# Patient Record
Sex: Male | Born: 1995 | Race: Black or African American | Hispanic: No | Marital: Single | Smoking: Never smoker
Health system: Southern US, Community
[De-identification: ages and names within clinical notes are randomized; demographics above are authoritative.]

---

## 2016-09-23 ENCOUNTER — Encounter (HOSPITAL_COMMUNITY): Payer: Self-pay | Admitting: Emergency Medicine

## 2016-09-23 ENCOUNTER — Emergency Department (HOSPITAL_COMMUNITY)
Admission: EM | Admit: 2016-09-23 | Discharge: 2016-09-23 | Disposition: A | Discharge status: Home / Self Care | Payer: Self-pay | Attending: Emergency Medicine | Admitting: Emergency Medicine

## 2016-09-23 DIAGNOSIS — Y929 Unspecified place or not applicable: Secondary | ICD-10-CM | POA: Insufficient documentation

## 2016-09-23 DIAGNOSIS — Y999 Unspecified external cause status: Secondary | ICD-10-CM | POA: Insufficient documentation

## 2016-09-23 DIAGNOSIS — T148XXA Other injury of unspecified body region, initial encounter: Secondary | ICD-10-CM

## 2016-09-23 DIAGNOSIS — M545 Low back pain: Secondary | ICD-10-CM | POA: Insufficient documentation

## 2016-09-23 DIAGNOSIS — X58XXXA Exposure to other specified factors, initial encounter: Secondary | ICD-10-CM | POA: Insufficient documentation

## 2016-09-23 DIAGNOSIS — Y9364 Activity, baseball: Secondary | ICD-10-CM | POA: Insufficient documentation

## 2016-09-23 DIAGNOSIS — S39011A Strain of muscle, fascia and tendon of abdomen, initial encounter: Secondary | ICD-10-CM | POA: Insufficient documentation

## 2016-09-23 LAB — URINALYSIS, ROUTINE W REFLEX MICROSCOPIC
Bilirubin Urine: NEGATIVE
Glucose, UA: NEGATIVE mg/dL
Hgb urine dipstick: NEGATIVE
KETONES UR: NEGATIVE mg/dL
LEUKOCYTES UA: NEGATIVE
NITRITE: NEGATIVE
Protein, ur: NEGATIVE mg/dL
SPECIFIC GRAVITY, URINE: 1.03 (ref 1.005–1.030)
pH: 7 (ref 5.0–8.0)

## 2016-09-23 MED ORDER — IBUPROFEN 600 MG PO TABS: 600.0000 mg | ORAL_TABLET | Freq: Four times a day (QID) | ORAL | 0 refills | Status: AC | PRN

## 2016-09-23 MED ORDER — IBUPROFEN 400 MG PO TABS
600.0000 mg | ORAL_TABLET | Freq: Once | ORAL | Status: AC
  Administered 2016-09-23: 20:00:00 600 mg via ORAL
  Filled 2016-09-23: qty 1

## 2016-09-23 NOTE — ED Provider Notes (Signed)
[facility] Provider Note    By signing my name below, I, Ronald Richards, attest that this documentation has been prepared under the direction and in the presence of [facility], [place]. Electronically Signed: Earmon Richards, ED Scribe. 09/23/16. 8:24 PM.    History   Chief Complaint Chief Complaint  Patient presents with  . Flank Pain   The history is provided by the patient and medical records. No language interpreter was used.    Ronald Richards is a 21 y.o. male who presents to the Emergency Department complaining of cramping left side flank pain that began earlier today while playing baseball. He states he did sit ups and jumping jacks prior to playing the game. He states the pain began while running to a base. He has not taken anything for pain relief. Laughing and movements increase the pain. Being still helps alleviate the pain. He denies fever, chills, nausea, vomiting or abdominal pain.   History reviewed. No pertinent past medical history.  There are no active problems to display for this patient.   History reviewed. No pertinent surgical history.     Home Medications    Prior to Admission medications   Medication Sig Start Date End Date Taking? Authorizing Provider  ibuprofen (ADVIL,MOTRIN) 600 MG tablet Take 1 tablet (600 mg total) by mouth every 6 (six) hours as needed. 09/23/16   Moria Brophy Orlene Och, NP    Family History History reviewed. No pertinent family history.  Social History Social History  Substance Use Topics  . Smoking status: Never Smoker  . Smokeless tobacco: Never Used  . Alcohol use No     Allergies   Patient has no known allergies.   Review of Systems Review of Systems  Constitutional: Negative for chills and fever.  Respiratory: Negative for cough, chest tightness and shortness of breath.   Cardiovascular: Negative for chest pain.  Gastrointestinal: Negative for abdominal pain, nausea and vomiting.  Genitourinary:  Positive for flank pain. Negative for dysuria and frequency.  Musculoskeletal: Positive for arthralgias. Negative for back pain (left flank pain).  Skin: Negative for color change and wound.  Neurological: Negative for weakness, light-headedness and headaches.  Psychiatric/Behavioral: Negative for confusion.     Physical Exam Updated Vital Signs BP 140/84 (BP Location: Right Arm)   Pulse 65   Temp 97.9 F (36.6 C) (Oral)   Resp 18   SpO2 100%   Physical Exam  Constitutional: He is oriented to person, place, and time. He appears well-developed and well-nourished. No distress.  HENT:  Head: Normocephalic and atraumatic.  Mouth/Throat: Mucous membranes are normal.  Eyes: EOM are normal. Pupils are equal, round, and reactive to light.  Neck: Normal range of motion. Neck supple.  Cardiovascular: Normal rate and regular rhythm.   Adequate circulation. Radial pulses 2+ bilaterally.  Pulmonary/Chest: Effort normal and breath sounds normal.  Abdominal: Soft. Bowel sounds are normal. There is no tenderness. There is no CVA tenderness.  Musculoskeletal: Normal range of motion. He exhibits no edema.       Lumbar back: He exhibits tenderness. He exhibits normal range of motion, no deformity, no spasm and normal pulse.  Pain to the left side with SLR of LLE. Tenderness to palpation of musculature of left sided anterior rib area. Pain with ROM.  Neurological: He is alert and oriented to person, place, and time. He has normal strength. Gait normal.  Reflex Scores:      Bicep reflexes are 2+ on the right side and 2+ on  the left side.      Brachioradialis reflexes are 2+ on the right side and 2+ on the left side.      Patellar reflexes are 2+ on the right side and 2+ on the left side.      Achilles reflexes are 2+ on the right side and 2+ on the left side. DTRs equal and symmetric bilaterally. Grip strength normal.  Skin: Skin is warm and dry.  Psychiatric: He has a normal mood and affect. His  behavior is normal.  Nursing note and vitals reviewed.    ED Treatments / Results  DIAGNOSTIC STUDIES: Oxygen Saturation is 100% on RA, normal by my interpretation.   COORDINATION OF CARE: 7:35 PM- Will check urinalysis. Pt verbalizes understanding and agrees to plan.  Medications  ibuprofen (ADVIL,MOTRIN) tablet 600 mg (600 mg Oral Given 09/23/16 2004)    Labs (all labs ordered are listed, but only abnormal results are displayed) Labs Reviewed  URINALYSIS, ROUTINE W REFLEX MICROSCOPIC - Abnormal; Notable for the following:       Result Value   APPearance CLOUDY (*)    All other components within normal limits    Radiology No results found.  Procedures Procedures (including critical care time)  Medications Ordered in ED Medications  ibuprofen (ADVIL,MOTRIN) tablet 600 mg (600 mg Oral Given 09/23/16 2004)     Initial Impression / Assessment and Plan / ED Course  I have reviewed the triage vital signs and the nursing notes.  Pertinent lab results that were available during my care of the patient were reviewed by me and considered in my medical decision making (see chart for details).    Patient here for muscle strain that began earlier today while playing baseball. He reports doing other exercises prior to onset of pain. Pt advised to follow up with PCP for continued symptoms. Conservative therapy recommended and discussed. Patient will be discharged home & is agreeable with above plan. Returns precautions discussed. Pt appears safe for discharge.  I personally performed the services described in this documentation, which was scribed in my presence. The recorded information has been reviewed and is accurate.   Final Clinical Impressions(s) / ED Diagnoses   Final diagnoses:  Muscle strain    New Prescriptions New Prescriptions   IBUPROFEN (ADVIL,MOTRIN) 600 MG TABLET    Take 1 tablet (600 mg total) by mouth every 6 (six) hours as needed.     [address]Hope FrederickM Dyrell Tuccillo,  [place]NP 09/23/16 2059    Ronald MondayErin Schlossman, MD 09/24/16 1051

## 2016-09-23 NOTE — ED Triage Notes (Signed)
Pt sts left sided flank pain after being in PE today

## 2017-10-12 ENCOUNTER — Other Ambulatory Visit: Payer: Self-pay | Admitting: Internal Medicine

## 2017-10-12 ENCOUNTER — Ambulatory Visit
Admission: RE | Admit: 2017-10-12 | Discharge: 2017-10-12 | Disposition: A | Discharge status: Home / Self Care | Payer: Self-pay | Source: Ambulatory Visit | Attending: Internal Medicine | Admitting: Internal Medicine

## 2017-10-12 DIAGNOSIS — A150 Tuberculosis of lung: Secondary | ICD-10-CM

## 2018-09-03 IMAGING — CR DG CHEST 1V
1 series · 1 of 1 positions shown · non-contrast
Comparison: None.

CLINICAL DATA: Positive PPD.

EXAM:
CHEST 1 VIEW

[w chest pa]
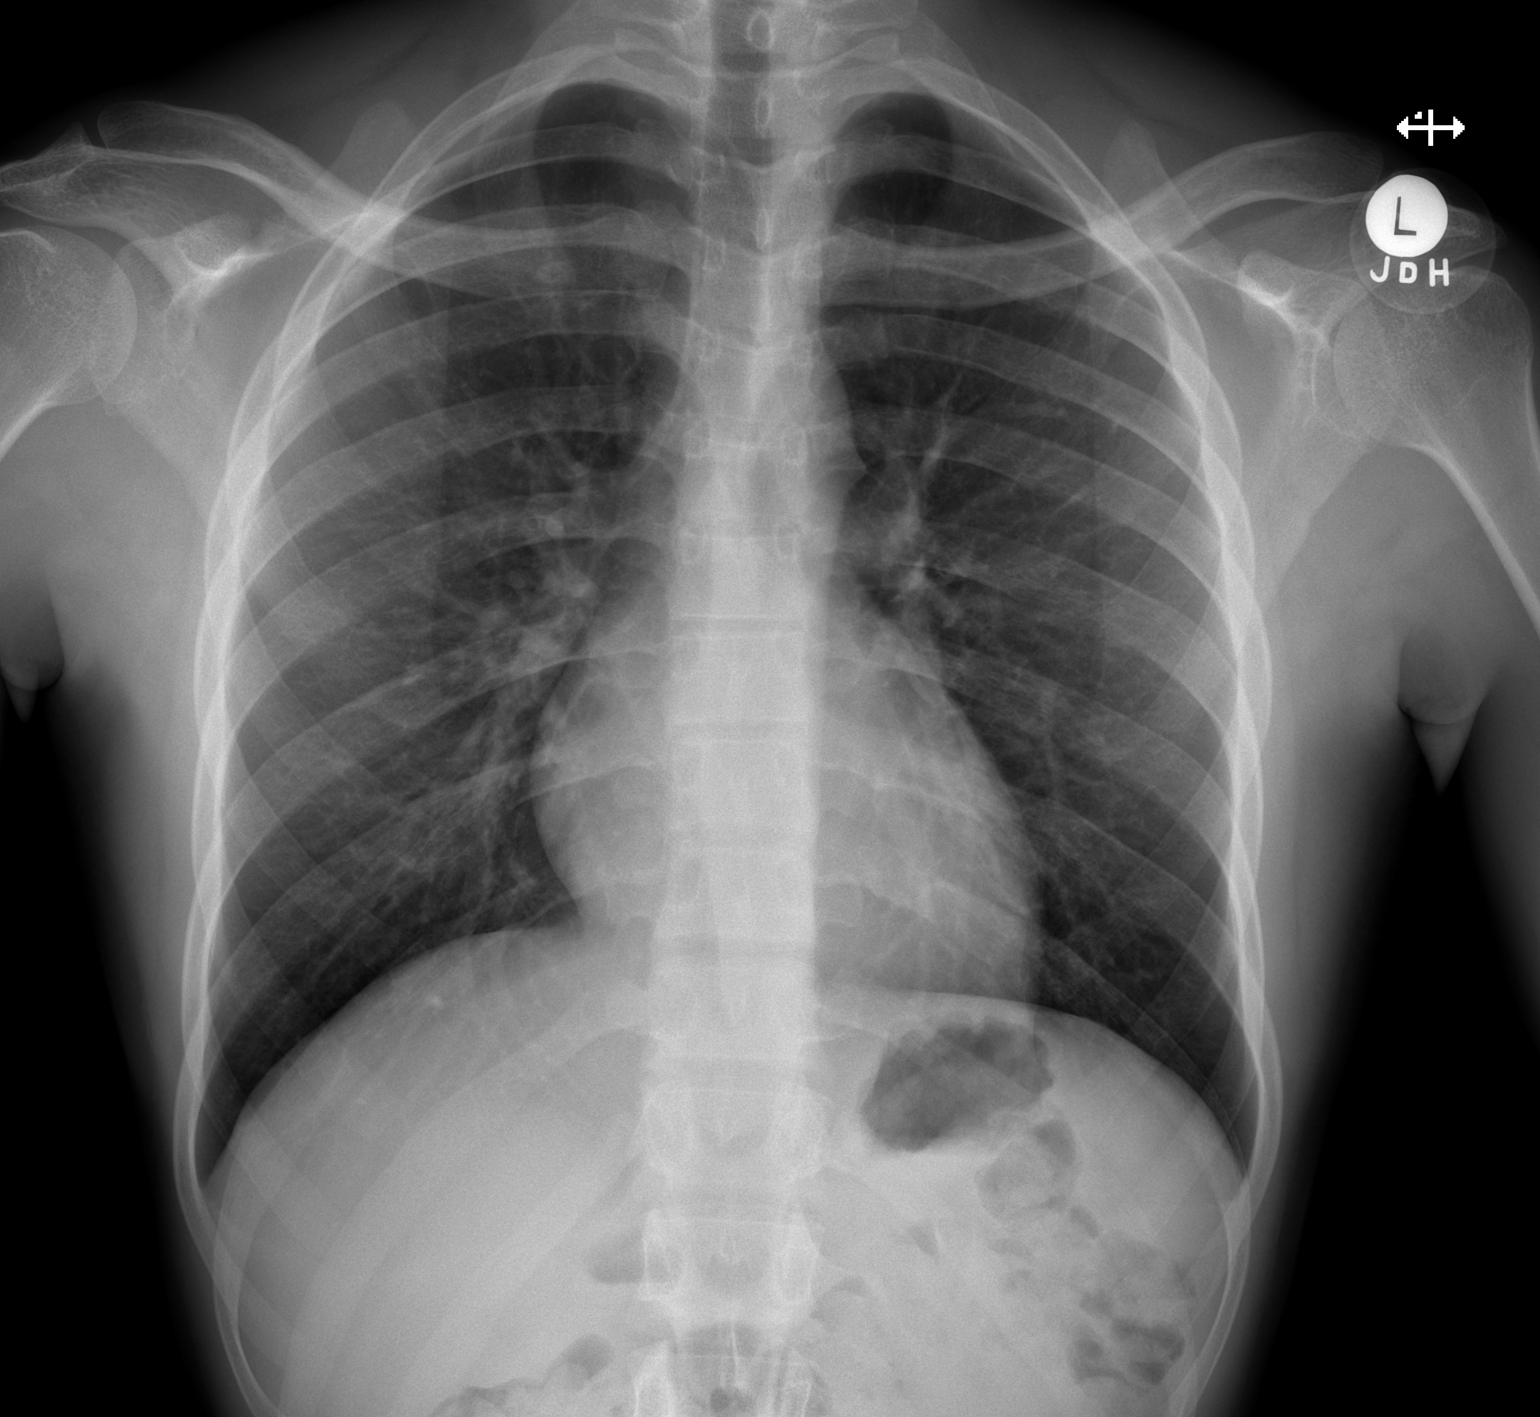

[1 of 1 positions shown; findings below may reference images not displayed]

FINDINGS: The heart size and mediastinal contours are within normal limits.
Both lungs are clear. The visualized skeletal structures are
unremarkable.
IMPRESSION: No active disease. Lungs are clear. No radiographic evidence of TB.

## 2022-11-19 ENCOUNTER — Emergency Department (HOSPITAL_COMMUNITY): Payer: Self-pay

## 2022-11-19 ENCOUNTER — Encounter (HOSPITAL_COMMUNITY): Payer: Self-pay

## 2022-11-19 ENCOUNTER — Emergency Department (HOSPITAL_COMMUNITY)
Admission: EM | Admit: 2022-11-19 | Discharge: 2022-11-19 | Disposition: A | Discharge status: Home / Self Care | Payer: Self-pay | Attending: Emergency Medicine | Admitting: Emergency Medicine

## 2022-11-19 ENCOUNTER — Other Ambulatory Visit: Payer: Self-pay

## 2022-11-19 DIAGNOSIS — R9089 Other abnormal findings on diagnostic imaging of central nervous system: Secondary | ICD-10-CM

## 2022-11-19 DIAGNOSIS — R519 Headache, unspecified: Secondary | ICD-10-CM | POA: Insufficient documentation

## 2022-11-19 DIAGNOSIS — Z23 Encounter for immunization: Secondary | ICD-10-CM | POA: Insufficient documentation

## 2022-11-19 DIAGNOSIS — R9402 Abnormal brain scan: Secondary | ICD-10-CM | POA: Insufficient documentation

## 2022-11-19 DIAGNOSIS — R55 Syncope and collapse: Secondary | ICD-10-CM

## 2022-11-19 LAB — RAPID URINE DRUG SCREEN, HOSP PERFORMED
Amphetamines: NOT DETECTED
Barbiturates: NOT DETECTED
Benzodiazepines: NOT DETECTED
Cocaine: NOT DETECTED
Opiates: NOT DETECTED
Tetrahydrocannabinol: POSITIVE — AB

## 2022-11-19 LAB — URINALYSIS, ROUTINE W REFLEX MICROSCOPIC
Bilirubin Urine: NEGATIVE
Glucose, UA: NEGATIVE mg/dL
Hgb urine dipstick: NEGATIVE
Ketones, ur: NEGATIVE mg/dL
Leukocytes,Ua: NEGATIVE
Nitrite: NEGATIVE
Protein, ur: NEGATIVE mg/dL
Specific Gravity, Urine: 1.012 (ref 1.005–1.030)
pH: 8 (ref 5.0–8.0)

## 2022-11-19 LAB — BASIC METABOLIC PANEL
Anion gap: 10 (ref 5–15)
BUN: 13 mg/dL (ref 6–20)
CO2: 24 mmol/L (ref 22–32)
Calcium: 9.5 mg/dL (ref 8.9–10.3)
Chloride: 102 mmol/L (ref 98–111)
Creatinine, Ser: 0.85 mg/dL (ref 0.61–1.24)
GFR, Estimated: >60 mL/min (ref >60)
Glucose, Bld: 131 mg/dL — ABNORMAL HIGH (ref 70–99)
Potassium: 4.1 mmol/L (ref 3.5–5.1)
Sodium: 136 mmol/L (ref 135–145)

## 2022-11-19 LAB — CBC
HCT: 44.8 % (ref 39.0–52.0)
Hemoglobin: 14.5 g/dL (ref 13.0–17.0)
MCH: 28.4 pg (ref 26.0–34.0)
MCHC: 32.4 g/dL (ref 30.0–36.0)
MCV: 87.7 fL (ref 80.0–100.0)
Platelets: 221 10*3/uL (ref 150–400)
RBC: 5.11 MIL/uL (ref 4.22–5.81)
RDW: 12.8 % (ref 11.5–15.5)
WBC: 5 10*3/uL (ref 4.0–10.5)
nRBC: 0 % (ref 0.0–0.2)

## 2022-11-19 LAB — CBG MONITORING, ED
Glucose-Capillary: 69 mg/dL — ABNORMAL LOW (ref 70–99)
Glucose-Capillary: 96 mg/dL (ref 70–99)

## 2022-11-19 MED ORDER — GADOBUTROL 1 MMOL/ML IV SOLN
6.5000 mL | Freq: Once | INTRAVENOUS | Status: AC | PRN
  Administered 2022-11-19: 6.5 mL via INTRAVENOUS

## 2022-11-19 MED ORDER — TETANUS-DIPHTH-ACELL PERTUSSIS 5-2.5-18.5 LF-MCG/0.5 IM SUSY
0.5000 mL | PREFILLED_SYRINGE | Freq: Once | INTRAMUSCULAR | Status: AC
  Administered 2022-11-19: 0.5 mL via INTRAMUSCULAR
  Filled 2022-11-19: qty 0.5

## 2022-11-19 MED ORDER — LORAZEPAM 2 MG/ML IJ SOLN
1.0000 mg | Freq: Once | INTRAMUSCULAR | Status: AC
Start: 2022-11-19 — End: 2022-11-19
  Administered 2022-11-19: 1 mg via INTRAVENOUS
  Filled 2022-11-19: qty 1

## 2022-11-19 MED ORDER — ACETAMINOPHEN 325 MG PO TABS
650.0000 mg | ORAL_TABLET | Freq: Once | ORAL | Status: AC
  Administered 2022-11-19: 650 mg via ORAL
  Filled 2022-11-19: qty 2

## 2022-11-19 NOTE — ED Provider Notes (Signed)
[place] EMERGENCY DEPARTMENT AT [facility] Provider Note   CSN: [id number] Arrival date & time: 11/19/22  1209     History {Add pertinent medical, surgical, social history, OB history to HPI:1} Chief Complaint  Patient presents with   Loss of Consciousness    Ronald Richards is a 27 y.o. male.  Patient was brought in by ambulance from work after possible syncopal event.  He said he had not eaten or drank anything today and was sitting in a meeting.  He said he was angry about something was going on and then he felt very weak and then he woke up on the ground with everyone standing around him.  He feels back to baseline now.  He did say he hit his head when he fell down and has a small laceration at his right eyebrow.  No chest pain shortness of breath numbness weakness headache shortness of breath.  He said he has had syncopal events in the past.  He denies any medical problems and does not take any routine medications.  No bowel or bladder incontinence.  The history is provided by the patient.  Loss of Consciousness Episode history:  Single Most recent episode:  Today Progression:  Resolved Chronicity:  New Witnessed: yes   Relieved by:  None tried Worsened by:  Nothing Ineffective treatments:  None tried Associated symptoms: no chest pain, no difficulty breathing, no fever, no headaches and no shortness of breath   Risk factors: no congenital heart disease, no coronary artery disease and no seizures        Home Medications Prior to Admission medications   Medication Sig Start Date End Date Taking? Authorizing Provider  ibuprofen (ADVIL,MOTRIN) 600 MG tablet Take 1 tablet (600 mg total) by mouth every 6 (six) hours as needed. 09/23/16   Janne Napoleon, NP      Allergies    Patient has no known allergies.    Review of Systems   Review of Systems  Constitutional:  Negative for fever.  Eyes:  Negative for visual disturbance.  Respiratory:  Negative  for shortness of breath.   Cardiovascular:  Positive for syncope. Negative for chest pain.  Gastrointestinal:  Negative for abdominal pain.  Musculoskeletal:  Negative for neck pain.  Neurological:  Negative for headaches.    Physical Exam Updated Vital Signs BP 130/75 (BP Location: Right Arm)   Pulse 60   Temp 98.1 F (36.7 C) (Oral)   Resp 17   Ht 5\' 9"  (1.753 m)   Wt 63 kg   SpO2 100%   BMI 20.53 kg/m  Physical Exam Vitals and nursing note reviewed.  Constitutional:      General: He is not in acute distress.    Appearance: Normal appearance. He is well-developed.  HENT:     Head: Normocephalic.     Comments: He has a small contusion in his right eyebrow with a tiny laceration that is not open and does not need any closure Eyes:     Conjunctiva/sclera: Conjunctivae normal.  Cardiovascular:     Rate and Rhythm: Normal rate and regular rhythm.     Heart sounds: No murmur heard. Pulmonary:     Effort: Pulmonary effort is normal. No respiratory distress.     Breath sounds: Normal breath sounds.  Abdominal:     Palpations: Abdomen is soft.     Tenderness: There is no abdominal tenderness. There is no guarding or rebound.  Musculoskeletal:  General: Normal range of motion.     Cervical back: Neck supple.     Right lower leg: No edema.     Left lower leg: No edema.  Skin:    General: Skin is warm and dry.     Capillary Refill: Capillary refill takes less than 2 seconds.  Neurological:     General: No focal deficit present.     Mental Status: He is alert and oriented to person, place, and time.     Cranial Nerves: No cranial nerve deficit.     Sensory: No sensory deficit.     Motor: No weakness.     Gait: Gait normal.     ED Results / Procedures / Treatments   Labs (all labs ordered are listed, but only abnormal results are displayed) Labs Reviewed  BASIC METABOLIC PANEL - Abnormal; Notable for the following components:      Result Value   Glucose, Bld  131 (*)    All other components within normal limits  CBC  URINALYSIS, ROUTINE W REFLEX MICROSCOPIC  CBG MONITORING, ED    EKG EKG Interpretation  Date/Time:  Wednesday November 19 2022 12:21:02 EDT Ventricular Rate:  55 PR Interval:  156 QRS Duration: 92 QT Interval:  382 QTC Calculation: 365 R Axis:   79 Text Interpretation: Sinus bradycardia Early repolarization Otherwise normal ECG No previous ECGs available Confirmed by Meridee Score [phone]) on 11/19/2022 4:40:48 PM  Radiology CT Head Wo Contrast  Result Date: 11/19/2022 CLINICAL DATA:  Trauma EXAM: CT HEAD WITHOUT CONTRAST TECHNIQUE: Contiguous axial images were obtained from the base of the skull through the vertex without intravenous contrast. RADIATION DOSE REDUCTION: This exam was performed according to the departmental dose-optimization program which includes automated exposure control, adjustment of the mA and/or kV according to patient size and/or use of iterative reconstruction technique. COMPARISON:  None Available. FINDINGS: Brain: There is no evidence of acute intracranial hemorrhage, extra-axial fluid collection, or acute infarct. Parenchymal volume is normal. The ventricles are normal in size. There is patchy hypodensity in the supratentorial white matter which is abnormal for age. The pituitary and suprasellar region are normal. There is no mass lesion. There is no mass effect or midline shift. Vascular: No hyperdense vessel or unexpected calcification. Skull: Normal. Negative for fracture or focal lesion. Sinuses/Orbits: Normal. Negative for fracture or focal lesion. There is mild mucosal thickening in the paranasal sinuses. The globes and orbits are unremarkable. Other: None. IMPRESSION: Patchy hypodensity in the supratentorial white matter is abnormal for age. Recommend brain MRI with and without contrast for further evaluation. Electronically Signed   By: Lesia Hausen M.D.   On: 11/19/2022 13:16    Procedures Procedures   {Document cardiac monitor, telemetry assessment procedure when appropriate:1}  Medications Ordered in ED Medications  Tdap (BOOSTRIX) injection 0.5 mL (has no administration in time range)    ED Course/ Medical Decision Making/ A&P   {   Click here for ABCD2, HEART and other calculatorsREFRESH Note before signing :1}                          Medical Decision Making Amount and/or Complexity of Data Reviewed Labs: ordered.   This patient complains of ***; this involves an extensive number of treatment Options and is a complaint that carries with it a high risk of complications and morbidity. The differential includes ***  I ordered, reviewed and interpreted labs, which included *** I ordered medication ***  and reviewed PMP when indicated. I ordered imaging studies which included *** and I independently    visualized and interpreted imaging which showed *** Additional history obtained from *** Previous records obtained and reviewed *** I consulted *** and discussed lab and imaging findings and discussed disposition.  Cardiac monitoring reviewed, *** Social determinants considered, *** Critical Interventions: ***  After the interventions stated above, I reevaluated the patient and found *** Admission and further testing considered, ***   {Document critical care time when appropriate:1} {Document review of labs and clinical decision tools ie heart score, Chads2Vasc2 etc:1}  {Document your independent review of radiology images, and any outside records:1} {Document your discussion with family members, caretakers, and with consultants:1} {Document social determinants of health affecting pt's care:1} {Document your decision making why or why not admission, treatments were needed:1} Final Clinical Impression(s) / ED Diagnoses Final diagnoses:  None    Rx / DC Orders ED Discharge Orders     None

## 2022-11-19 NOTE — ED Notes (Signed)
Patient provided graham crackers and drink.

## 2022-11-19 NOTE — ED Triage Notes (Signed)
Pt BIB [facility] from work c/o LOC. Pt states he has not had anything to eat or drink today. Pt was sitting in a work meeting and was talking finished what he said and all of a sudden got weak and was unable to hear anything and then next thing he knows he woke up  on the ground and they said he passed out.

## 2022-11-19 NOTE — ED Notes (Signed)
Patient transported to MRI 

## 2022-11-19 NOTE — ED Provider Triage Note (Signed)
Emergency Medicine Provider Triage Evaluation Note  Ronald Richards , a 27 y.o. male  was evaluated in triage.  Pt complains of syncope. Was at a work meeting this AM. While standing he felt lightheadedness then syncopized.  Struck head against floor.  Mild headache, no neck pain, no cp or palpitation.  Noever had this before.  Didn't eat breakfast this AM.  Was standing when this happened  Review of Systems  Positive: As above Negative: As above  Physical Exam  BP 127/73 (BP Location: Right Arm)   Pulse (!) 53   Temp 98.2 F (36.8 C)   Resp 17   Ht 5\' 9"  (1.753 m)   Wt 63 kg   SpO2 100%   BMI 20.53 kg/m  Gen:   Awake, no distress   Resp:  Normal effort  MSK:   Moves extremities without difficulty  Other:    Medical Decision Making  Medically screening exam initiated at 12:26 PM.  Appropriate orders placed.  Ronald Richards was informed that the remainder of the evaluation will be completed by another provider, this initial triage assessment does not replace that evaluation, and the importance of remaining in the ED until their evaluation is complete.     Fayrene Helper, PA-C 11/19/22 1228

## 2022-11-19 NOTE — Discharge Instructions (Signed)
You were seen in the emergency department for a syncopal/fainting episode at work.  You had blood work and an EKG that was unremarkable.  You had a CAT scan and MRI of your brain that did show some findings that we will need follow-up with neurology.  This may have something to do with your fainting spell.  Will be important for you to follow-up with them.  Please return to the emergency department for any worsening or concerning symptoms.

## 2023-08-06 ENCOUNTER — Ambulatory Visit (HOSPITAL_COMMUNITY)
Admission: EM | Admit: 2023-08-06 | Discharge: 2023-08-06 | Disposition: A | Discharge status: Home / Self Care | Payer: Commercial Managed Care - HMO | Attending: Emergency Medicine | Admitting: Emergency Medicine

## 2023-08-06 ENCOUNTER — Encounter (HOSPITAL_COMMUNITY): Payer: Self-pay

## 2023-08-06 DIAGNOSIS — A084 Viral intestinal infection, unspecified: Secondary | ICD-10-CM

## 2023-08-06 DIAGNOSIS — R112 Nausea with vomiting, unspecified: Secondary | ICD-10-CM

## 2023-08-06 MED ORDER — ONDANSETRON 4 MG PO TBDP
4.0000 mg | ORAL_TABLET | Freq: Once | ORAL | Status: AC
  Administered 2023-08-06: 4 mg via ORAL

## 2023-08-06 MED ORDER — ONDANSETRON 4 MG PO TBDP
ORAL_TABLET | ORAL | Status: AC
  Filled 2023-08-06: qty 1

## 2023-08-06 MED ORDER — ONDANSETRON HCL 4 MG PO TABS: 4.0000 mg | ORAL_TABLET | Freq: Four times a day (QID) | ORAL | 0 refills | Status: AC

## 2023-08-06 NOTE — ED Provider Notes (Signed)
[facility]    CSN: [id number] Arrival date & time: 08/06/23  1810      History   Chief Complaint Chief Complaint  Patient presents with   Emesis   Fever    HPI Ronald Richards is a 27 y.o. male.   Patient reporting fever this morning with emesis x 3.  Emesis content includes mostly undigested food from last night.  No one else is reporting being sick.  He has recently been able to hold down some water but no food at this time.  The history is provided by the patient.  Emesis Associated symptoms: fever   Fever Associated symptoms: nausea and vomiting     History reviewed. No pertinent past medical history.  There are no active problems to display for this patient.   History reviewed. No pertinent surgical history.     Home Medications    Prior to Admission medications   Medication Sig Start Date End Date Taking? Authorizing Provider  ondansetron (ZOFRAN) 4 MG tablet Take 1 tablet (4 mg total) by mouth every 6 (six) hours. 08/06/23  Yes Marque Bango, Linde Gillis, NP  ibuprofen (ADVIL,MOTRIN) 600 MG tablet Take 1 tablet (600 mg total) by mouth every 6 (six) hours as needed. 09/23/16   Janne Napoleon, NP    Family History History reviewed. No pertinent family history.  Social History Social History   Tobacco Use   Smoking status: Some Days    Types: Cigarettes   Smokeless tobacco: Never  Vaping Use   Vaping status: Every Day   Substances: Nicotine, Flavoring  Substance Use Topics   Alcohol use: No   Drug use: No     Allergies   Patient has no known allergies.   Review of Systems Review of Systems  Constitutional:  Positive for fever.  Gastrointestinal:  Positive for nausea and vomiting.     Physical Exam Triage Vital Signs ED Triage Vitals  Encounter Vitals Group     BP 08/06/23 1849 120/72     Systolic BP Percentile --      Diastolic BP Percentile --      Pulse Rate 08/06/23 1849 70     Resp 08/06/23 1849 16     Temp  08/06/23 1849 98.1 F (36.7 C)     Temp Source 08/06/23 1849 Oral     SpO2 08/06/23 1849 97 %     Weight 08/06/23 1848 139 lb (63 kg)     Height 08/06/23 1848 5\' 9"  (1.753 m)     Head Circumference --      Peak Flow --      Pain Score 08/06/23 1847 0     Pain Loc --      Pain Education --      Exclude from Growth Chart --    No data found.  Updated Vital Signs BP 120/72 (BP Location: Left Arm)   Pulse 70   Temp 98.1 F (36.7 C) (Oral)   Resp 16   Ht 5\' 9"  (1.753 m)   Wt 139 lb (63 kg)   SpO2 97%   BMI 20.53 kg/m   Visual Acuity Right Eye Distance:   Left Eye Distance:   Bilateral Distance:    Right Eye Near:   Left Eye Near:    Bilateral Near:     Physical Exam HENT:     Mouth/Throat:     Mouth: Mucous membranes are moist.  Eyes:     Pupils: Pupils are equal, round,  and reactive to light.  Cardiovascular:     Rate and Rhythm: Normal rate and regular rhythm.     Heart sounds: Normal heart sounds.  Abdominal:     General: Abdomen is flat.     Palpations: Abdomen is soft.     Comments: Epigastric tenderness  Neurological:     Mental Status: He is alert.      UC Treatments / Results  Labs (all labs ordered are listed, but only abnormal results are displayed) Labs Reviewed - No data to display  EKG   Radiology No results found.  Procedures Procedures (including critical care time)  Medications Ordered in UC Medications  ondansetron (ZOFRAN-ODT) disintegrating tablet 4 mg (4 mg Oral Given 08/06/23 1912)    Initial Impression / Assessment and Plan / UC Course  I have reviewed the triage vital signs and the nursing notes.  Pertinent labs & imaging results that were available during my care of the patient were reviewed by me and considered in my medical decision making (see chart for details).   Patient signed symptoms of GI virus.  We will treat with Zofran.  He is encouraged to monitor hydration and advance diet as tolerated.  Patient to  follow-up if no improvement or worsening of symptoms over the next 2 to 3 days.  Final Clinical Impressions(s) / UC Diagnoses   Final diagnoses:  Nausea and vomiting in adult  Viral gastroenteritis     Discharge Instructions      You have been diagnosed with a GI virus.  Take Zofran for nausea.  Ensure adequate hydration.  Hold foods that are greasy or spicy.  Start with simple foods such as bananas, rice, applesauce, toast, scrambled eggs.  Advance diet as tolerated.  You may have a sore stomach over the next couple of days but you should improve quickly.  If no improvement or worsening of symptoms return to urgent care or follow-up with PCP.     ED Prescriptions     Medication Sig Dispense Auth. Provider   ondansetron (ZOFRAN) 4 MG tablet Take 1 tablet (4 mg total) by mouth every 6 (six) hours. 12 tablet Tasnia Spegal, Linde Gillis, NP      PDMP not reviewed this encounter.   Nelda Marseille, NP 08/06/23 [phone]

## 2023-08-06 NOTE — ED Triage Notes (Signed)
Patient having fever and emesis onset this morning. No one with similar symptoms or sick. No dietary or medication changes.   Patient took tylenol for headache with mild relief.

## 2023-08-06 NOTE — Discharge Instructions (Signed)
You have been diagnosed with a GI virus.  Take Zofran for nausea.  Ensure adequate hydration.  Hold foods that are greasy or spicy.  Start with simple foods such as bananas, rice, applesauce, toast, scrambled eggs.  Advance diet as tolerated.  You may have a sore stomach over the next couple of days but you should improve quickly.  If no improvement or worsening of symptoms return to urgent care or follow-up with PCP.
